# Patient Record
Sex: Female | Born: 1995 | Race: Black or African American | Hispanic: No | Marital: Single | Smoking: Never smoker
Health system: Southern US, Community
[De-identification: ages and names within clinical notes are randomized; demographics above are authoritative.]

---
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
## 2013-08-08 ENCOUNTER — Emergency Department (HOSPITAL_COMMUNITY): Payer: Medicaid Other

## 2013-08-08 ENCOUNTER — Emergency Department (HOSPITAL_COMMUNITY)
Admission: EM | Admit: 2013-08-08 | Discharge: 2013-08-08 | Disposition: A | Discharge status: Home / Self Care | Payer: Medicaid Other | Attending: Emergency Medicine | Admitting: Emergency Medicine

## 2013-08-08 ENCOUNTER — Encounter (HOSPITAL_COMMUNITY): Payer: Self-pay | Admitting: Emergency Medicine

## 2013-08-08 DIAGNOSIS — Z880 Allergy status to penicillin: Secondary | ICD-10-CM | POA: Insufficient documentation

## 2013-08-08 DIAGNOSIS — M25519 Pain in unspecified shoulder: Secondary | ICD-10-CM | POA: Insufficient documentation

## 2013-08-08 DIAGNOSIS — IMO0001 Reserved for inherently not codable concepts without codable children: Secondary | ICD-10-CM | POA: Insufficient documentation

## 2013-08-08 DIAGNOSIS — M542 Cervicalgia: Secondary | ICD-10-CM | POA: Insufficient documentation

## 2013-08-08 DIAGNOSIS — M25511 Pain in right shoulder: Secondary | ICD-10-CM

## 2013-08-08 MED ORDER — PREDNISONE 50 MG PO TABS: 50.0000 mg | ORAL_TABLET | Freq: Every day | ORAL | Status: DC

## 2013-08-08 MED ORDER — HYDROCODONE-ACETAMINOPHEN 5-325 MG PO TABS: 1.0000 | ORAL_TABLET | Freq: Four times a day (QID) | ORAL | Status: DC | PRN

## 2013-08-08 NOTE — ED Notes (Signed)
Pt reports onset R shoulder pain/ tenderness 1 wk ago. Pt states she noticed knot to R shoulder, denies any trauma to the area. Pt reports she is taking PE and thinks she may have strained a muscle. No swelling noted. Full ROM.

## 2013-08-08 NOTE — ED Provider Notes (Signed)
CSN: [id number]     Arrival date & time 08/08/13  1629 History  This chart was scribed for non-physician practitioner working with No att. providers found by Ashley JacobsBrittany Andrews, ED scribe. This patient was seen in room WTR5/WTR5 and the patient's care was started at 4:48 PM.   None    Chief Complaint  Patient presents with  . Shoulder Pain     (Consider location/radiation/quality/duration/timing/severity/associated sxs/prior Treatment) HPI HPI Comments: Dawn Mccoy is a 18 y.o. female who presents to the Emergency Department complaining of gradually worsening right arm pain, onset possible one week ago. She was in a physical education course when she noticed the pain.The pain is worse with palpation. She explains when she rotates her head she has shooting pain down her right arm. Also, her arm "jumps" randomly. No swelling is noted Noting seems to make her symptoms better. Pt does not have any prior known medical complications. She has allergies to "penicillins".  History reviewed. No pertinent past medical history. History reviewed. No pertinent past surgical history. No family history on file. History  Substance Use Topics  . Smoking status: Never Smoker   . Smokeless tobacco: Not on file  . Alcohol Use: No   OB History   Grav Para Term Preterm Abortions TAB SAB Ect Mult Living                 Review of Systems  Musculoskeletal: Positive for arthralgias, myalgias and neck pain. Negative for joint swelling.      Allergies  Penicillins  Home Medications   Current Outpatient Rx  Name  Route  Sig  Dispense  Refill  . ibuprofen (ADVIL,MOTRIN) 200 MG tablet   Oral   Take 200 mg by mouth every 6 (six) hours as needed. Pain         . phenazopyridine (PYRIDIUM) 200 MG tablet   Oral   Take 1 tablet (200 mg total) by mouth 3 (three) times daily.   6 tablet   0   . sulfamethoxazole-trimethoprim (BACTRIM DS,SEPTRA DS) 800-160 MG per tablet   Oral   Take 1 tablet by  mouth 2 (two) times daily.   6 tablet   0    BP 126/74  Pulse 62  Temp(Src) 98.4 F (36.9 C) (Oral)  Resp 14  Ht 5\' 2"  (1.575 m)  Wt 120 lb (54.432 kg)  BMI 21.94 kg/m2  SpO2 100%  LMP 06/18/2013 Physical Exam  Constitutional: She is oriented to person, place, and time. She appears well-developed and well-nourished. No distress.  HENT:  Head: Normocephalic and atraumatic.  Pulmonary/Chest: Effort normal.  Musculoskeletal:       Right shoulder: She exhibits decreased range of motion, tenderness and pain. She exhibits no bony tenderness, no swelling, no effusion, no crepitus, no deformity, no laceration, no spasm, normal pulse and normal strength.  Neurological: She is alert and oriented to person, place, and time.    ED Course  Procedures (including critical care time) DIAGNOSTIC STUDIES: Oxygen Saturation is 100% on room air, normal by my interpretation.    COORDINATION OF CARE:  4:51 PM Discussed course of care with pt . Pt understands and agrees.   Patient be referred to orthopedics.  Advised of x-ray results would return here as needed.  Told to use ice and heat on her shoulder.  X-rays did not show any abnormality  Carlyle DollyChristopher W Noralee Dutko, PA-C 08/08/13 1742

## 2013-08-08 NOTE — Discharge Instructions (Signed)
Return here as needed.  Followup with the orthopedist provided.  Use ice and heat on your shoulder, your x-rays did not show any abnormalities

## 2013-08-09 NOTE — ED Provider Notes (Signed)
Medical screening examination/treatment/procedure(s) were performed by non-physician practitioner and as supervising physician I was immediately available for consultation/collaboration.   EKG Interpretation None       Raeford RazorStephen Yamaira Spinner, MD 08/09/13 1733

## 2013-11-13 ENCOUNTER — Emergency Department (HOSPITAL_COMMUNITY): Payer: Medicaid Other

## 2013-11-13 ENCOUNTER — Emergency Department (HOSPITAL_COMMUNITY)
Admission: EM | Admit: 2013-11-13 | Discharge: 2013-11-13 | Disposition: A | Discharge status: Home / Self Care | Payer: Medicaid Other | Attending: Emergency Medicine | Admitting: Emergency Medicine

## 2013-11-13 ENCOUNTER — Encounter (HOSPITAL_COMMUNITY): Payer: Self-pay | Admitting: Emergency Medicine

## 2013-11-13 DIAGNOSIS — N760 Acute vaginitis: Secondary | ICD-10-CM | POA: Insufficient documentation

## 2013-11-13 DIAGNOSIS — B9689 Other specified bacterial agents as the cause of diseases classified elsewhere: Secondary | ICD-10-CM

## 2013-11-13 DIAGNOSIS — Z880 Allergy status to penicillin: Secondary | ICD-10-CM | POA: Diagnosis not present

## 2013-11-13 DIAGNOSIS — O26899 Other specified pregnancy related conditions, unspecified trimester: Secondary | ICD-10-CM

## 2013-11-13 DIAGNOSIS — Z79899 Other long term (current) drug therapy: Secondary | ICD-10-CM | POA: Diagnosis not present

## 2013-11-13 DIAGNOSIS — A498 Other bacterial infections of unspecified site: Secondary | ICD-10-CM | POA: Diagnosis not present

## 2013-11-13 DIAGNOSIS — N898 Other specified noninflammatory disorders of vagina: Secondary | ICD-10-CM | POA: Insufficient documentation

## 2013-11-13 DIAGNOSIS — R109 Unspecified abdominal pain: Secondary | ICD-10-CM | POA: Insufficient documentation

## 2013-11-13 DIAGNOSIS — O9989 Other specified diseases and conditions complicating pregnancy, childbirth and the puerperium: Secondary | ICD-10-CM | POA: Insufficient documentation

## 2013-11-13 LAB — CBC WITH DIFFERENTIAL/PLATELET
BASOS ABS: 0 10*3/uL (ref 0.0–0.1)
BASOS PCT: 0 % (ref 0–1)
EOS ABS: 0 10*3/uL (ref 0.0–0.7)
EOS PCT: 0 % (ref 0–5)
HCT: 39 % (ref 36.0–46.0)
Hemoglobin: 12.9 g/dL (ref 12.0–15.0)
Lymphocytes Relative: 20 % (ref 12–46)
Lymphs Abs: 1.4 10*3/uL (ref 0.7–4.0)
MCH: 24.4 pg — AB (ref 26.0–34.0)
MCHC: 33.1 g/dL (ref 30.0–36.0)
MCV: 73.7 fL — AB (ref 78.0–100.0)
Monocytes Absolute: 0.4 10*3/uL (ref 0.1–1.0)
Monocytes Relative: 7 % (ref 3–12)
Neutro Abs: 4.9 10*3/uL (ref 1.7–7.7)
Neutrophils Relative %: 73 % (ref 43–77)
PLATELETS: 216 10*3/uL (ref 150–400)
RBC: 5.29 MIL/uL — ABNORMAL HIGH (ref 3.87–5.11)
RDW: 15.6 % — AB (ref 11.5–15.5)
WBC: 6.7 10*3/uL (ref 4.0–10.5)

## 2013-11-13 LAB — BASIC METABOLIC PANEL
ANION GAP: 14 (ref 5–15)
BUN: 8 mg/dL (ref 6–23)
CALCIUM: 9.4 mg/dL (ref 8.4–10.5)
CO2: 20 mEq/L (ref 19–32)
Chloride: 101 mEq/L (ref 96–112)
Creatinine, Ser: 0.46 mg/dL — ABNORMAL LOW (ref 0.50–1.10)
GFR calc Af Amer: >90 mL/min (ref >90)
Glucose, Bld: 90 mg/dL (ref 70–99)
Potassium: 3.6 mEq/L — ABNORMAL LOW (ref 3.7–5.3)
SODIUM: 135 meq/L — AB (ref 137–147)

## 2013-11-13 LAB — URINALYSIS, ROUTINE W REFLEX MICROSCOPIC
BILIRUBIN URINE: NEGATIVE
Glucose, UA: NEGATIVE mg/dL
HGB URINE DIPSTICK: NEGATIVE
KETONES UR: NEGATIVE mg/dL
Leukocytes, UA: NEGATIVE
NITRITE: NEGATIVE
Protein, ur: NEGATIVE mg/dL
Specific Gravity, Urine: 1.022 (ref 1.005–1.030)
UROBILINOGEN UA: 0.2 mg/dL (ref 0.0–1.0)
pH: 7.5 (ref 5.0–8.0)

## 2013-11-13 LAB — HCG, QUANTITATIVE, PREGNANCY: HCG, BETA CHAIN, QUANT, S: 101091 m[IU]/mL — AB (ref <5)

## 2013-11-13 LAB — WET PREP, GENITAL: Trich, Wet Prep: NONE SEEN

## 2013-11-13 MED ORDER — METRONIDAZOLE 500 MG PO TABS: 500.0000 mg | ORAL_TABLET | Freq: Two times a day (BID) | ORAL | Status: AC

## 2013-11-13 NOTE — ED Notes (Signed)
PA at bedside.

## 2013-11-13 NOTE — ED Notes (Signed)
US at bedside

## 2013-11-13 NOTE — ED Provider Notes (Signed)
CSN: [id number]     Arrival date & time 11/13/13  0957 History   First MD Initiated Contact with Patient 11/13/13 1050     Chief Complaint  Patient presents with  . Vaginal Discharge    pregnant     (Consider location/radiation/quality/duration/timing/severity/associated sxs/prior Treatment) HPI Comments: Patient is a 18 year old 632P0 female female at about 3156w0d who presents with right side pelvic pain that started this morning. Patient reports urinating this morning and noticing light brown discharged when she wiped as well. The pain is located in her right pelvic area and does not radiate. The pain is described as cramping and severe. The pain started gradually and progressively worsened since the onset. No alleviating/aggravating factors. The patient has tried nothing for symptoms without relief. Patient denies fever, headache, NVD, chest pain, SOB, dysuria, constipation.   History reviewed. No pertinent past medical history. History reviewed. No pertinent past surgical history. No family history on file. History  Substance Use Topics  . Smoking status: Never Smoker   . Smokeless tobacco: Not on file  . Alcohol Use: No   OB History   Grav Para Term Preterm Abortions TAB SAB Ect Mult Living   1              Review of Systems  Constitutional: Negative for fever, chills and fatigue.  HENT: Negative for trouble swallowing.   Eyes: Negative for visual disturbance.  Respiratory: Negative for shortness of breath.   Cardiovascular: Negative for chest pain and palpitations.  Gastrointestinal: Positive for abdominal pain. Negative for nausea, vomiting and diarrhea.  Genitourinary: Positive for vaginal bleeding and vaginal discharge. Negative for dysuria and difficulty urinating.  Musculoskeletal: Negative for arthralgias and neck pain.  Skin: Negative for color change.  Neurological: Negative for dizziness and weakness.  Psychiatric/Behavioral: Negative for dysphoric mood.       Allergies  Penicillins  Home Medications   Prior to Admission medications   Medication Sig Start Date End Date Taking? Authorizing Provider  Prenatal MV-Min-Fe Fum-FA-DHA (PRENATAL 1 PO) Take 1 tablet by mouth daily.   Yes Historical Provider, MD   BP 117/76  Pulse 89  Temp(Src) 98.4 F (36.9 C) (Oral)  Resp 16  Ht 5\' 2"  (1.575 m)  SpO2 100%  LMP 06/18/2013 Physical Exam  Nursing note and vitals reviewed. Constitutional: She is oriented to person, place, and time. She appears well-developed and well-nourished. No distress.  HENT:  Head: Normocephalic and atraumatic.  Eyes: Conjunctivae are normal.  Neck: Normal range of motion.  Cardiovascular: Normal rate and regular rhythm.  Exam reveals no gallop and no friction rub.   No murmur heard. Pulmonary/Chest: Effort normal and breath sounds normal. She has no wheezes. She has no rales. She exhibits no tenderness.  Abdominal: Soft. She exhibits no distension. There is no tenderness. There is no rebound.  Genitourinary: Vagina normal.  Normal external genitalia. Scant amount of light brown mucous in vagina. No CMT. Cervical os closed. No tenderness to palpation on bimanual exam or adnexal tenderness.   Musculoskeletal: Normal range of motion.  Neurological: She is alert and oriented to person, place, and time. Coordination normal.  Speech is goal-oriented. Moves limbs without ataxia.   Skin: Skin is warm and dry.  Psychiatric: She has a normal mood and affect. Her behavior is normal.    ED Course  Procedures (including critical care time) Labs Review Labs Reviewed  WET PREP, GENITAL - Abnormal; Notable for the following:    Yeast Wet Prep  HPF POC FEW (*)    Clue Cells Wet Prep HPF POC FEW (*)    WBC, Wet Prep HPF POC FEW (*)    All other components within normal limits  CBC WITH DIFFERENTIAL - Abnormal; Notable for the following:    RBC 5.29 (*)    MCV 73.7 (*)    MCH 24.4 (*)    RDW 15.6 (*)    All other  components within normal limits  BASIC METABOLIC PANEL - Abnormal; Notable for the following:    Sodium 135 (*)    Potassium 3.6 (*)    Creatinine, Ser 0.46 (*)    All other components within normal limits  HCG, QUANTITATIVE, PREGNANCY - Abnormal; Notable for the following:    hCG, Beta Chain, Quant, S 101091 (*)    All other components within normal limits  URINALYSIS, ROUTINE W REFLEX MICROSCOPIC - Abnormal; Notable for the following:    APPearance CLOUDY (*)    All other components within normal limits  GC/CHLAMYDIA PROBE AMP    Imaging Review US Ob Comp Less 14 Wks  11/13/2013   CLINICAL DATA:  Vaginal bleeding and pelvic pain  EXAM: OBSTETRIC <14 WK ULTRASOUND  TECHNIQUE: Transabdominal ultrasound was performed for evaluation of the gestation as well as the maternal uterus and adnexal regions.  COMPARISON:  None.  FINDINGS: Intrauterine gestational sac: Present.  Yolk sac:  Not visible  Embryo:  Single  Cardiac Activity: Present  Heart Rate: 150 bpm  CRL:   6.4  cm   12 w 5 d                  [place] EDC: May 23, 2014  Maternal uterus/adnexae: There is a small subchorionic hemorrhage measuring 1.9 cm. The maternal ovaries could not be demonstrated. There is no free pelvic fluid.  IMPRESSION: 1. There is a viable IUP with estimated gestational age of [redacted] weeks 5 days corresponding to an estimated date of confinement of May 23, 2014. 2. There is a small subchorionic hemorrhage.   Electronically Signed   By: David  [place]   On: 11/13/2013 14:56     EKG Interpretation None      MDM   Final diagnoses:  Abdominal pain in pregnancy  BV (bacterial vaginosis)   12:22 PM Labs and wet prep pending. Patient will have pelvic US for further evaluation.   3:25 PM Patient's US shows viable IUP with subchorionic hemmorhage. Patient will be treated for BV due to clue cells on wet prep. Patient instructed to follow up at [facility] in 2 days for HCG recheck. Patient instructed to go to  [facility] with worsening or concerning symptoms.    Emilia Beck, PA-C 11/13/13 1528

## 2013-11-13 NOTE — ED Notes (Signed)
Pt states this morning woke up and had a sharp pain in R pelvic region, then after urinating states had light brown discharge, pt states she is [redacted] weeks pregnant.

## 2013-11-13 NOTE — Discharge Instructions (Signed)
Take Flagyl as directed until gone. Follow up at [facility] in 2 days for recheck of your pregnancy hormone. Refer to attached documents for more information.

## 2013-11-14 LAB — GC/CHLAMYDIA PROBE AMP
CT PROBE, AMP APTIMA: NEGATIVE
GC PROBE AMP APTIMA: NEGATIVE

## 2013-11-14 NOTE — ED Provider Notes (Signed)
Medical screening examination/treatment/procedure(s) were performed by non-physician practitioner and as supervising physician I was immediately available for consultation/collaboration.   Wilfredo Canterbury T Olawale Marney, MD 11/14/13 2249 

## 2014-03-03 ENCOUNTER — Encounter (HOSPITAL_COMMUNITY): Payer: Self-pay | Admitting: Emergency Medicine

## 2015-05-06 IMAGING — CR DG SHOULDER 2+V*R*
3 series · 3 of 3 positions shown · non-contrast
Comparison: None.

CLINICAL DATA: Pain.

EXAM:
RIGHT SHOULDER - 2+ VIEW

[w shoulder internal right]
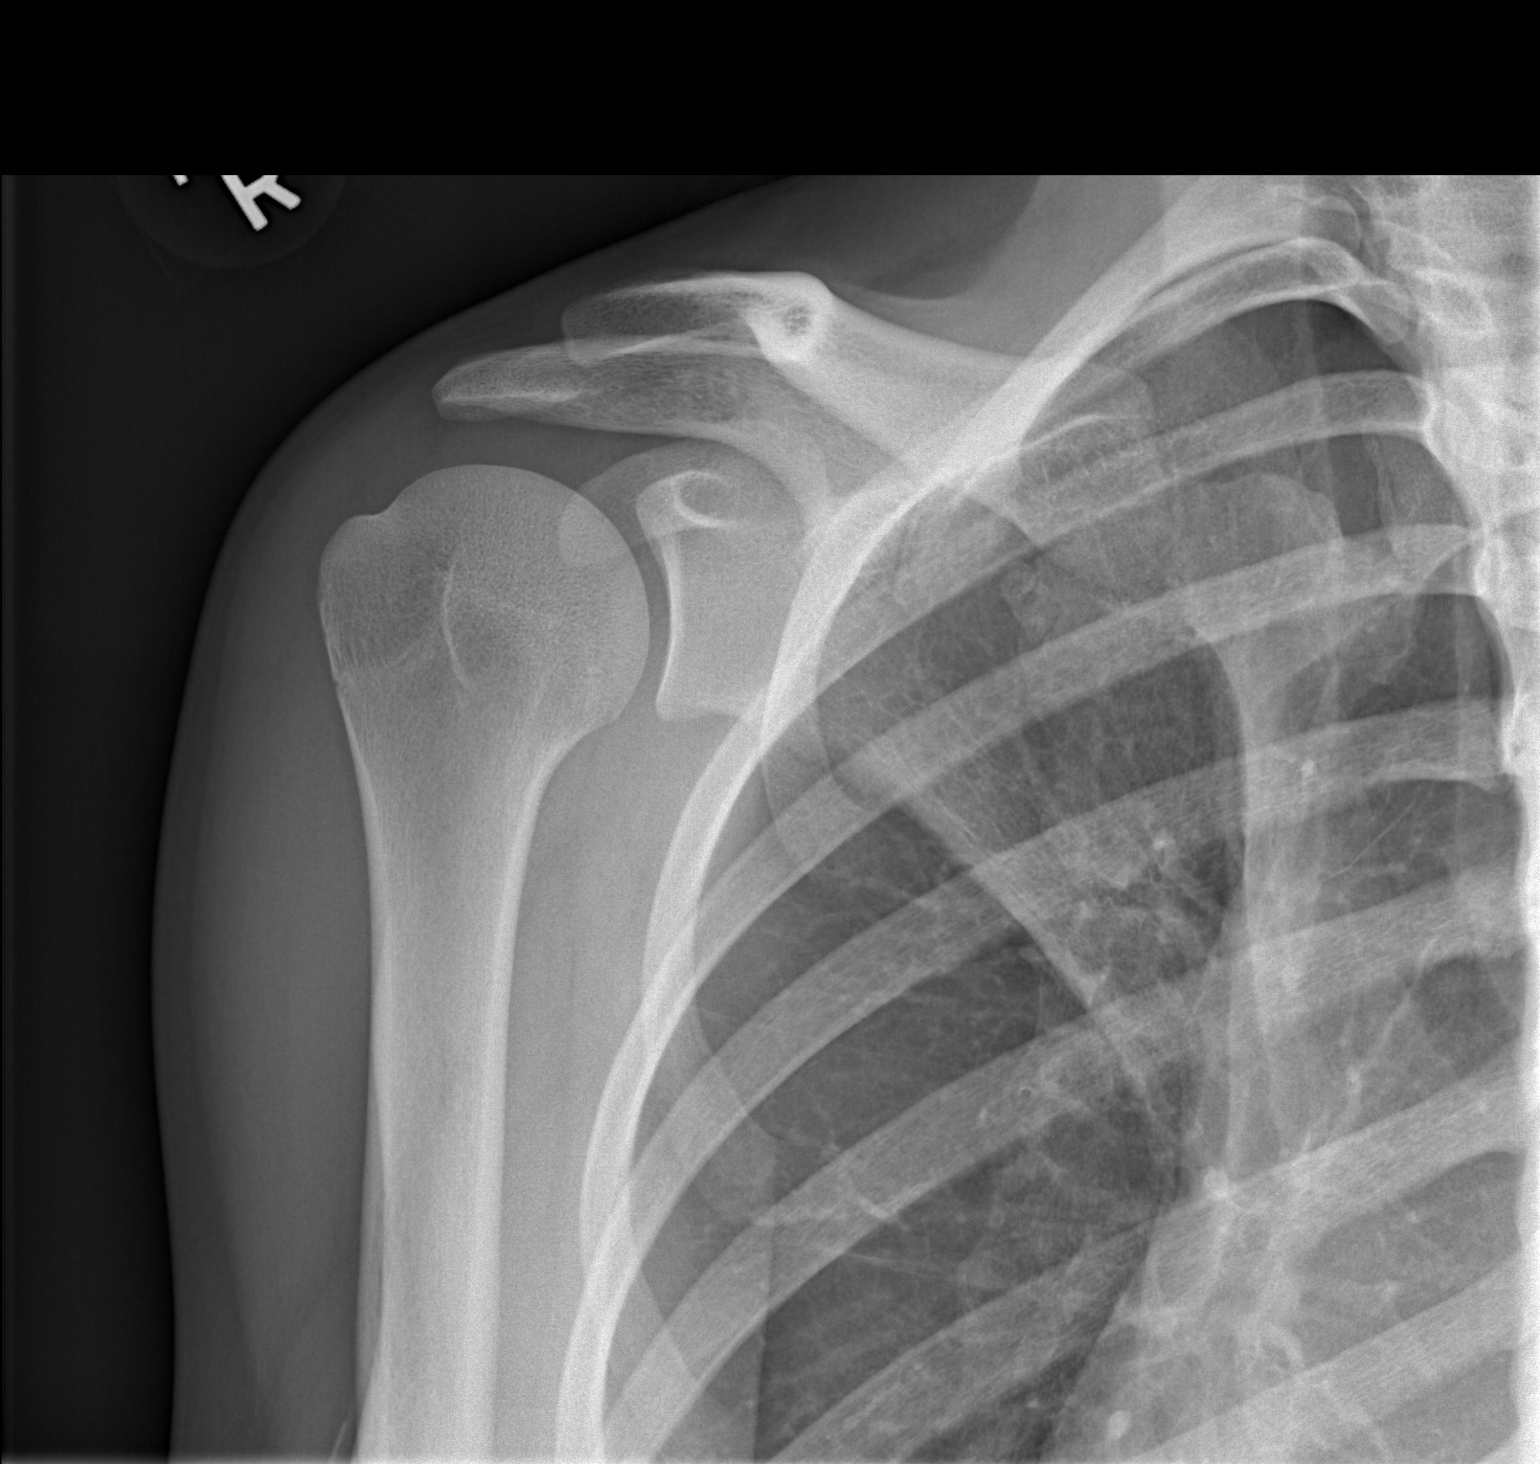

[w shoulder y-view right]
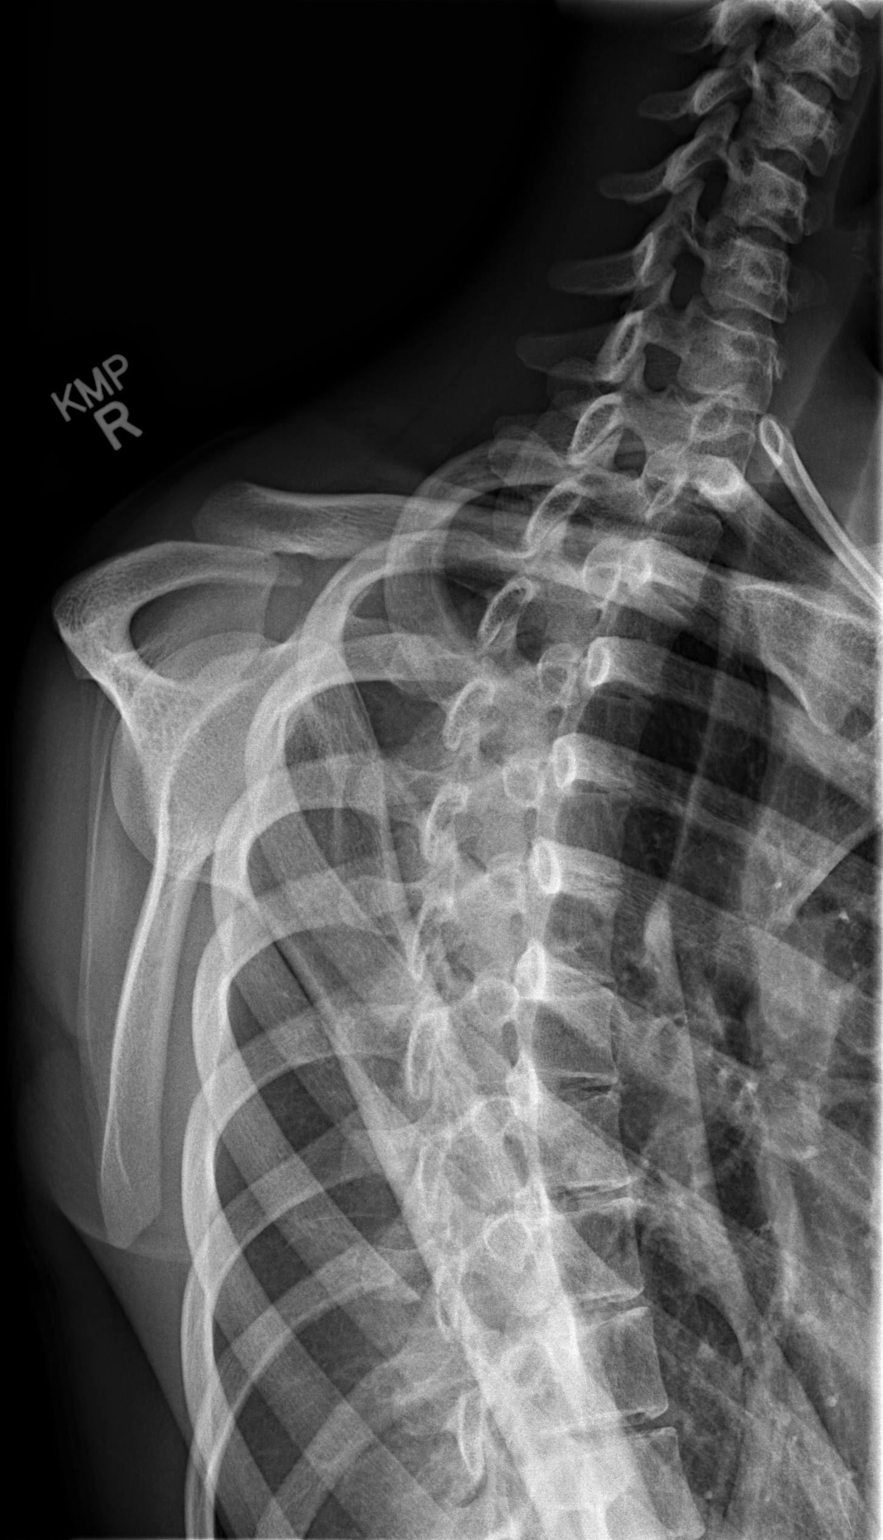

[x shoulder axillary right]
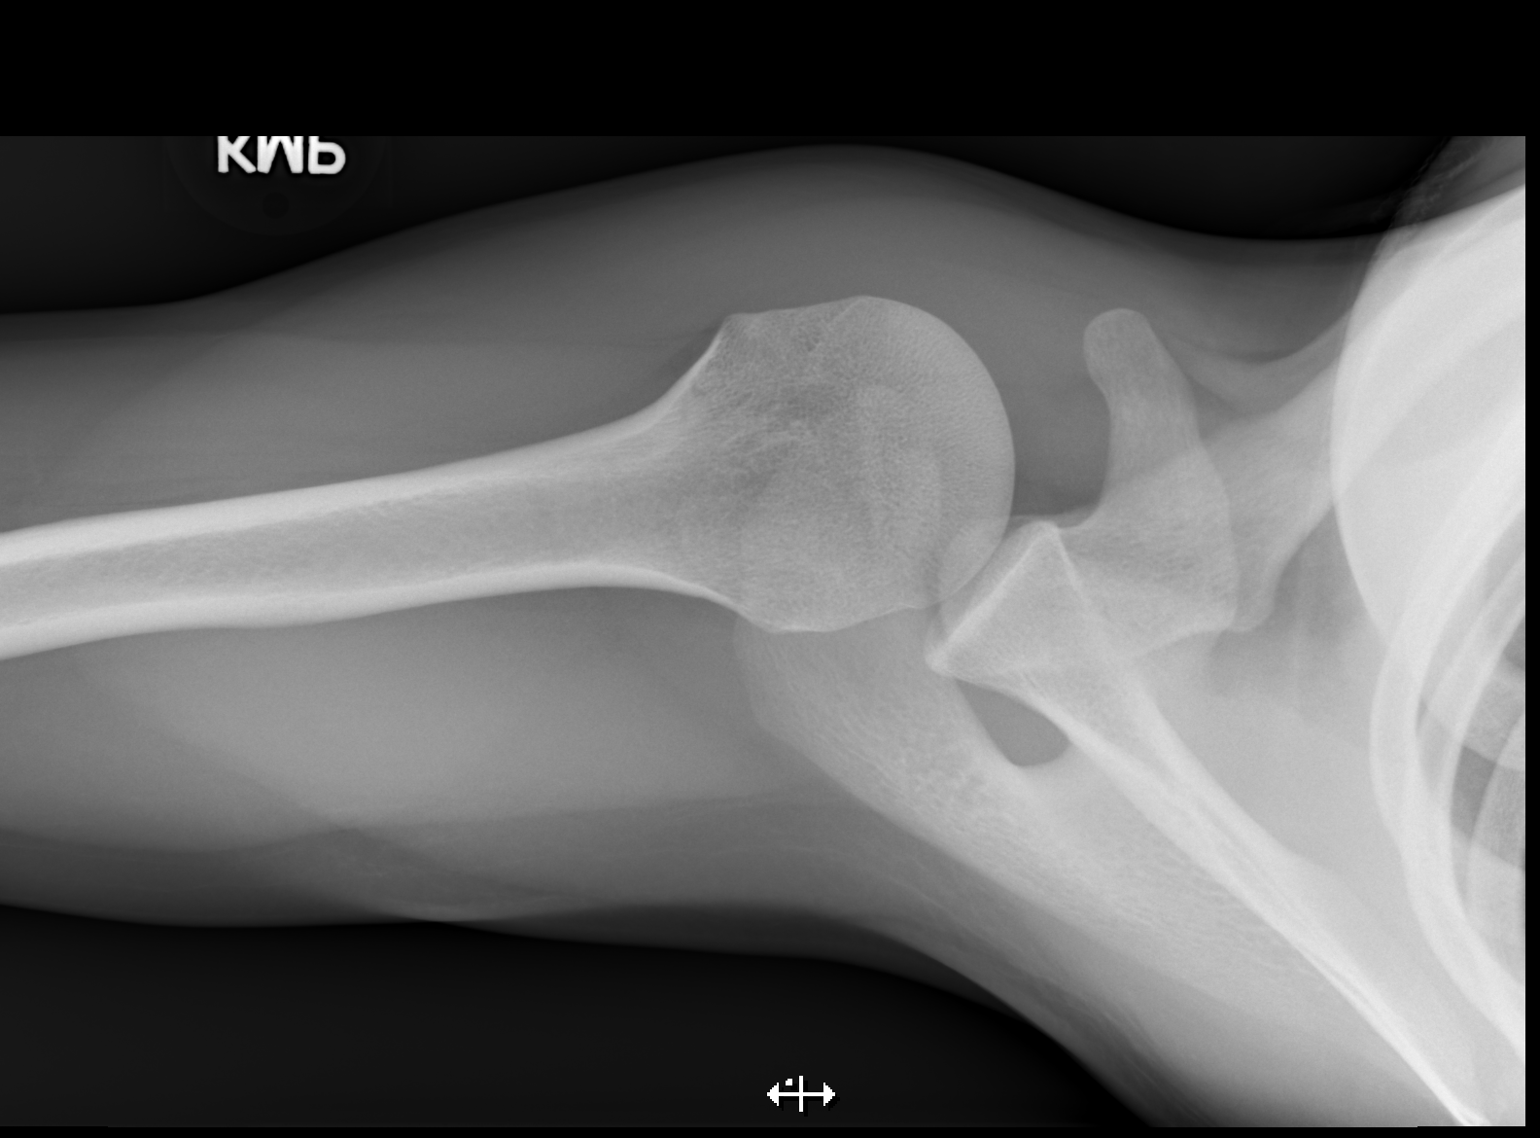

[3 of 3 positions shown; findings below may reference images not displayed]

FINDINGS: There is no evidence of fracture or dislocation. There is no
evidence of arthropathy or other focal bone abnormality. Soft
tissues are unremarkable.
IMPRESSION: Negative.

## 2015-08-11 IMAGING — US US OB COMP LESS 14 WK
1 series · 14 of 28 positions shown · non-contrast
Comparison: None.

CLINICAL DATA: Vaginal bleeding and pelvic pain

EXAM:
OBSTETRIC <14 WK ULTRASOUND
TECHNIQUE: Transabdominal ultrasound was performed for evaluation of the
gestation as well as the maternal uterus and adnexal regions.

[Series 1: us ob comp less 14 wk · 0.20mm/px · 29 acquisitions, 14 frames shown]
[im 2/29]
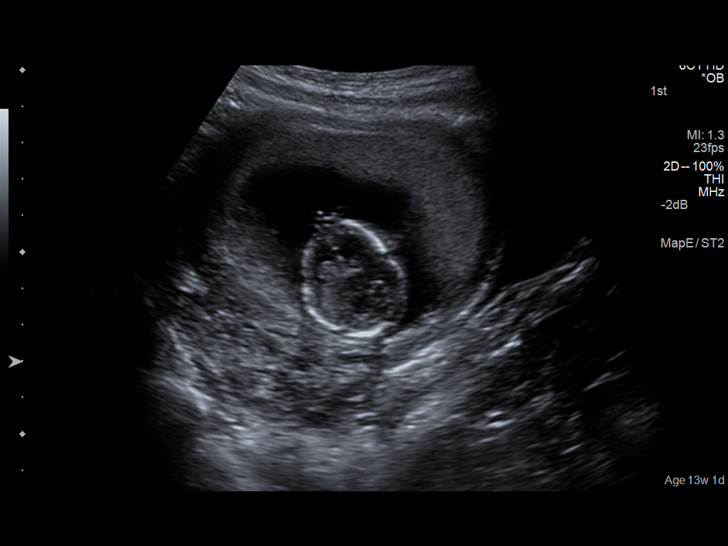
[im 4/29]
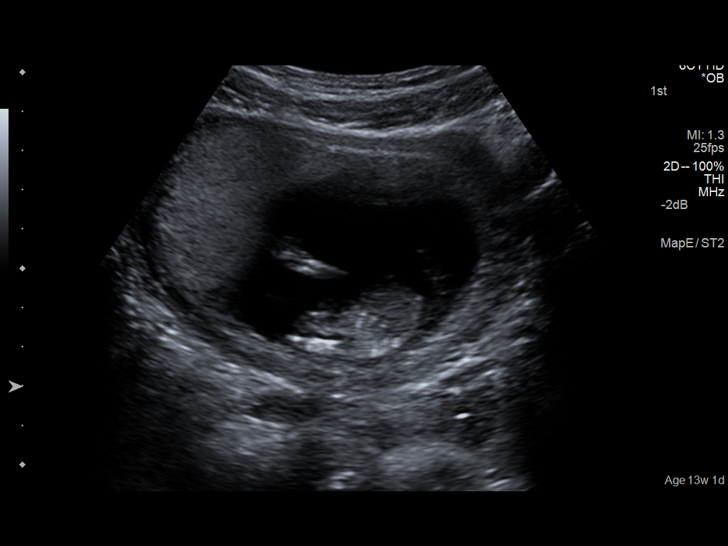
[im 6/29]
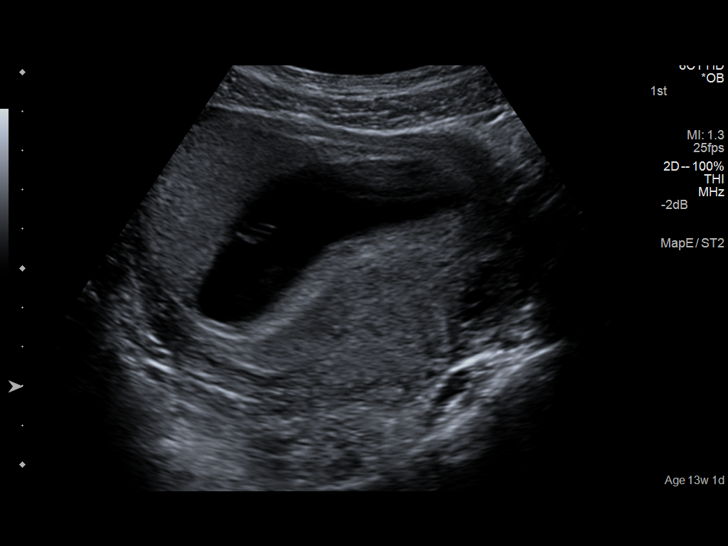
[im 8/29]
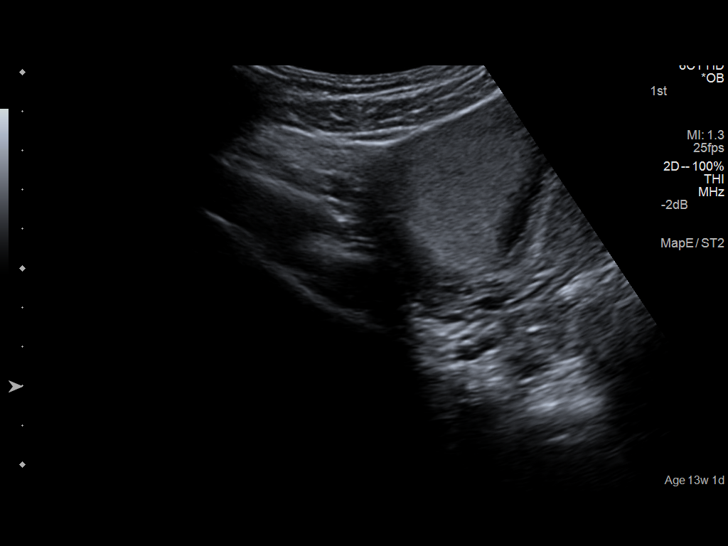
[im 10/29]
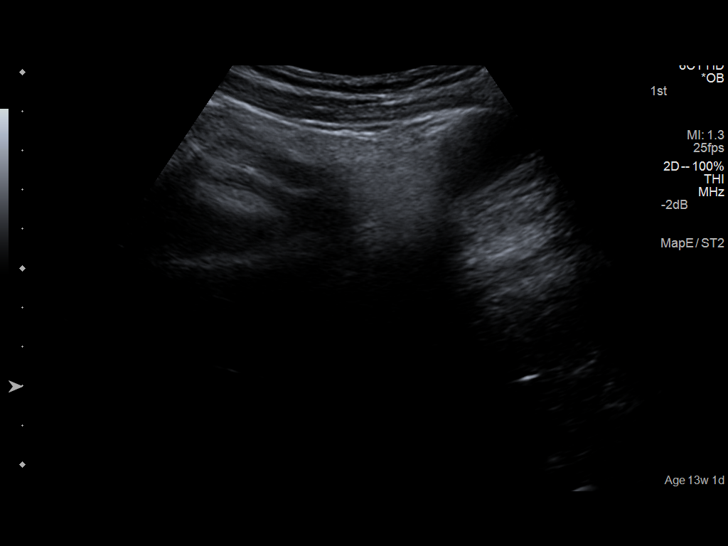
[im 12/29]
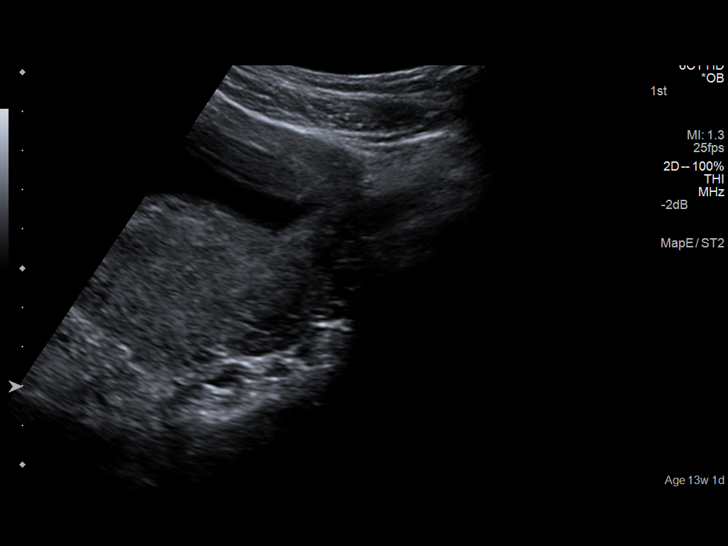
[im 14/29]
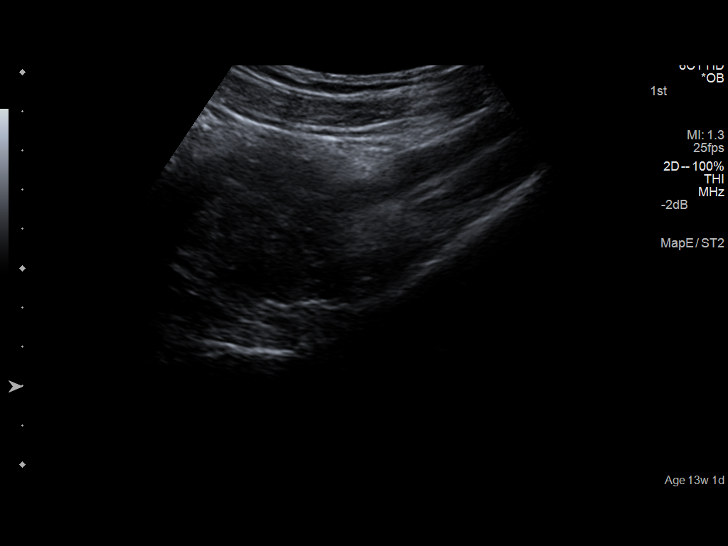
[im 16/29]
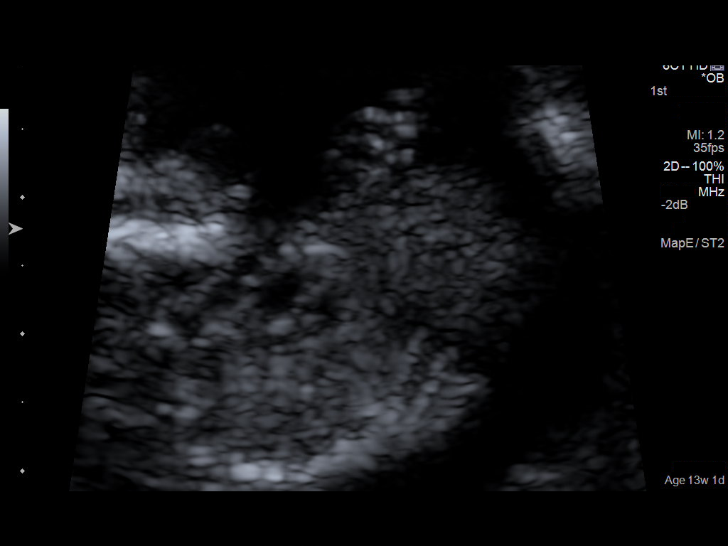
[im 18/29]
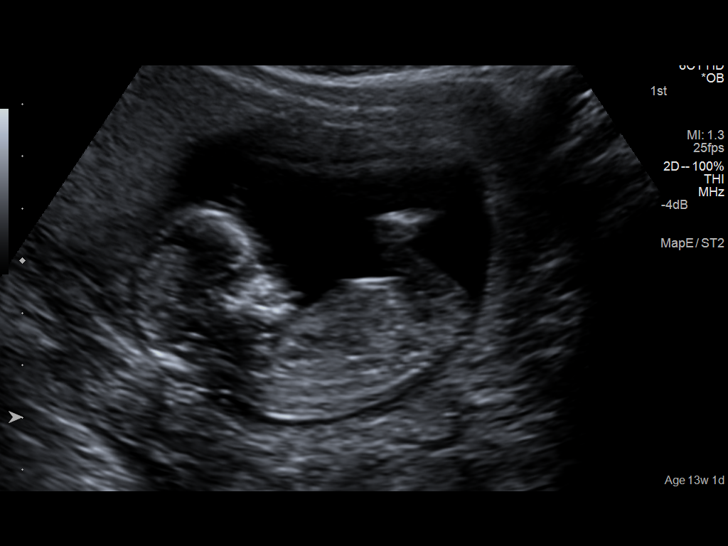
[im 20/29]
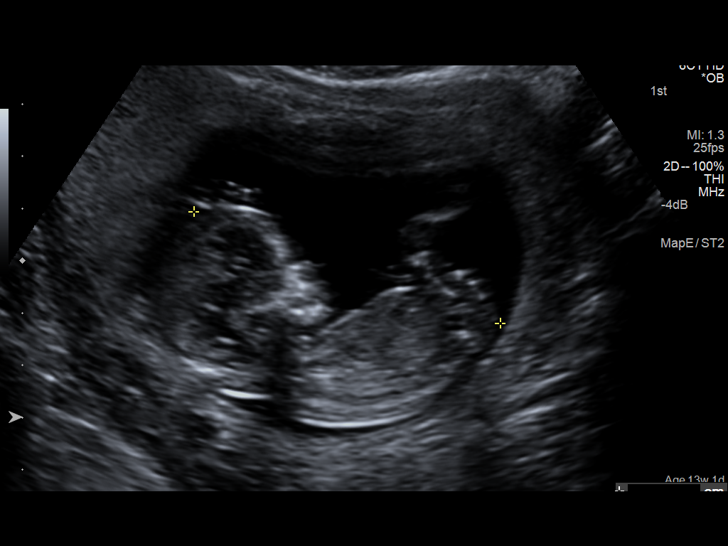
[im 22/29]
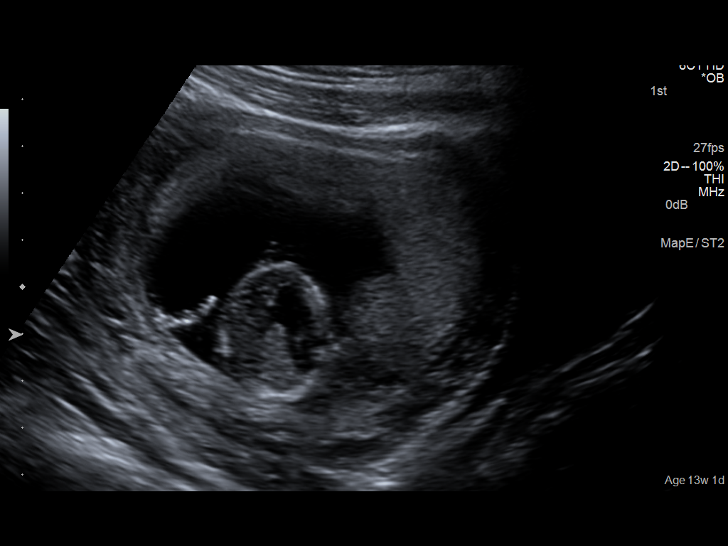
[im 24/29]
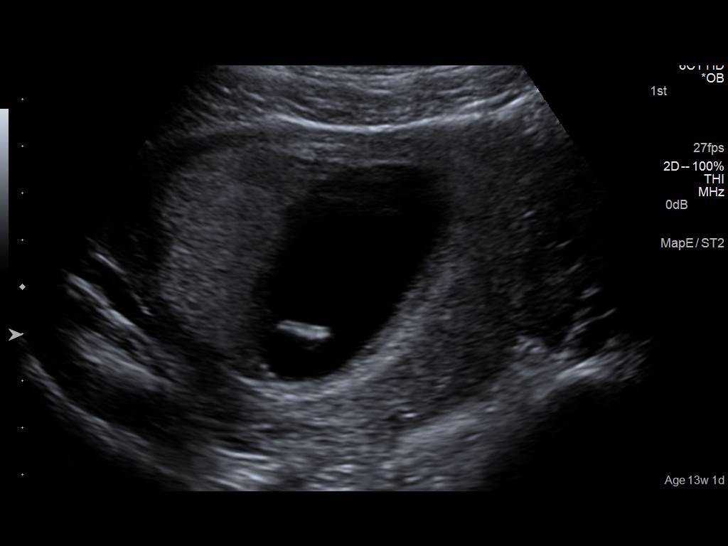
[im 26/29]
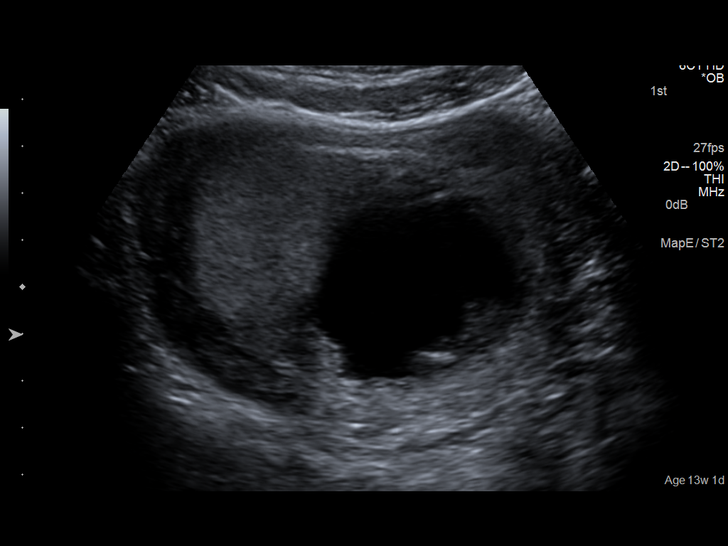
[im 29/29]
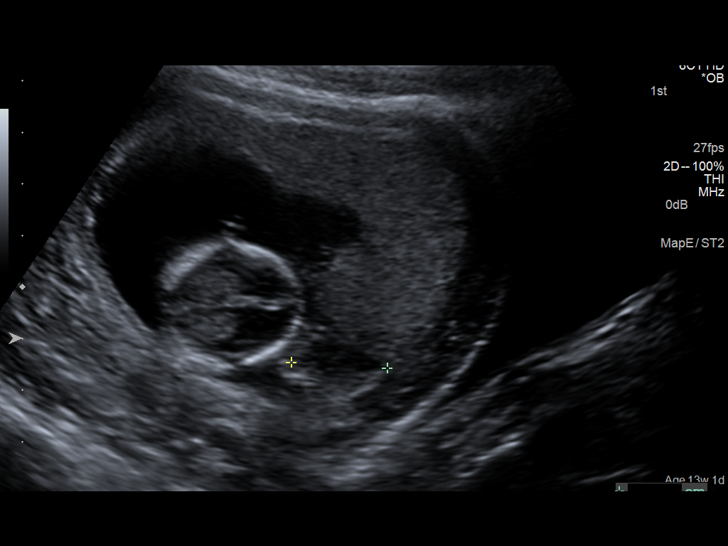

[14 of 28 positions shown; findings below may reference images not displayed]

FINDINGS: Intrauterine gestational sac: Present.

Yolk sac:  Not visible

Embryo:  Single

Cardiac Activity: Present

Heart Rate: 150 bpm

CRL:   6.4  cm   12 w 5 d                  [place] EDC: May 23, 2014

Maternal uterus/adnexae: There is a small subchorionic hemorrhage
measuring 1.9 cm. The maternal ovaries could not be demonstrated.
There is no free pelvic fluid.
IMPRESSION: 1. There is a viable IUP with estimated gestational age of 12 weeks
5 days corresponding to an estimated date of confinement [DATE]. There is a small subchorionic hemorrhage.
